# Patient Record
Sex: Male | Born: 1985 | Race: White | Hispanic: No | Marital: Single | Smoking: Former smoker
Health system: Southern US, Community
[De-identification: ages and names within clinical notes are randomized; demographics above are authoritative.]

## PROBLEM LIST (undated history)

## (undated) DIAGNOSIS — Z789 Other specified health status: Secondary | ICD-10-CM

## (undated) DIAGNOSIS — F419 Anxiety disorder, unspecified: Secondary | ICD-10-CM

## (undated) HISTORY — PX: WRIST SURGERY: SHX841

---
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
## 2006-01-22 ENCOUNTER — Emergency Department (HOSPITAL_COMMUNITY): Admission: EM | Admit: 2006-01-22 | Discharge: 2006-01-22 | Payer: Self-pay | Admitting: Emergency Medicine

## 2006-06-18 ENCOUNTER — Emergency Department (HOSPITAL_COMMUNITY): Admission: EM | Admit: 2006-06-18 | Discharge: 2006-06-19 | Payer: Self-pay | Admitting: Emergency Medicine

## 2006-07-07 ENCOUNTER — Emergency Department (HOSPITAL_COMMUNITY): Admission: EM | Admit: 2006-07-07 | Discharge: 2006-07-07 | Payer: Self-pay | Admitting: Emergency Medicine

## 2006-08-30 ENCOUNTER — Emergency Department (HOSPITAL_COMMUNITY): Admission: EM | Admit: 2006-08-30 | Discharge: 2006-08-30 | Payer: Self-pay | Admitting: Emergency Medicine

## 2006-09-13 ENCOUNTER — Emergency Department (HOSPITAL_COMMUNITY): Admission: EM | Admit: 2006-09-13 | Discharge: 2006-09-13 | Payer: Self-pay | Admitting: Emergency Medicine

## 2006-12-01 ENCOUNTER — Emergency Department (HOSPITAL_COMMUNITY): Admission: EM | Admit: 2006-12-01 | Discharge: 2006-12-01 | Payer: Self-pay | Admitting: Emergency Medicine

## 2006-12-17 ENCOUNTER — Emergency Department: Payer: Self-pay | Admitting: Emergency Medicine

## 2006-12-18 ENCOUNTER — Emergency Department (HOSPITAL_COMMUNITY): Admission: EM | Admit: 2006-12-18 | Discharge: 2006-12-18 | Payer: Self-pay | Admitting: Emergency Medicine

## 2007-01-17 ENCOUNTER — Emergency Department (HOSPITAL_COMMUNITY): Admission: EM | Admit: 2007-01-17 | Discharge: 2007-01-17 | Payer: Self-pay | Admitting: Emergency Medicine

## 2007-01-25 ENCOUNTER — Emergency Department (HOSPITAL_COMMUNITY): Admission: EM | Admit: 2007-01-25 | Discharge: 2007-01-25 | Payer: Self-pay | Admitting: Emergency Medicine

## 2007-02-10 ENCOUNTER — Emergency Department (HOSPITAL_COMMUNITY): Admission: EM | Admit: 2007-02-10 | Discharge: 2007-02-10 | Payer: Self-pay | Admitting: Emergency Medicine

## 2007-02-20 ENCOUNTER — Emergency Department (HOSPITAL_COMMUNITY): Admission: EM | Admit: 2007-02-20 | Discharge: 2007-02-20 | Payer: Self-pay | Admitting: Emergency Medicine

## 2007-04-26 ENCOUNTER — Emergency Department (HOSPITAL_COMMUNITY): Admission: EM | Admit: 2007-04-26 | Discharge: 2007-04-26 | Payer: Self-pay | Admitting: Emergency Medicine

## 2007-07-01 ENCOUNTER — Emergency Department: Payer: Self-pay | Admitting: Emergency Medicine

## 2007-07-06 ENCOUNTER — Emergency Department (HOSPITAL_COMMUNITY): Admission: EM | Admit: 2007-07-06 | Discharge: 2007-07-06 | Payer: Self-pay | Admitting: Emergency Medicine

## 2007-08-12 ENCOUNTER — Emergency Department (HOSPITAL_COMMUNITY): Admission: EM | Admit: 2007-08-12 | Discharge: 2007-08-12 | Payer: Self-pay | Admitting: Emergency Medicine

## 2007-10-16 ENCOUNTER — Emergency Department (HOSPITAL_COMMUNITY): Admission: EM | Admit: 2007-10-16 | Discharge: 2007-10-16 | Payer: Self-pay | Admitting: Emergency Medicine

## 2008-02-07 ENCOUNTER — Emergency Department (HOSPITAL_COMMUNITY): Admission: EM | Admit: 2008-02-07 | Discharge: 2008-02-07 | Payer: Self-pay | Admitting: Emergency Medicine

## 2008-02-28 ENCOUNTER — Emergency Department (HOSPITAL_COMMUNITY): Admission: EM | Admit: 2008-02-28 | Discharge: 2008-02-28 | Payer: Self-pay | Admitting: Emergency Medicine

## 2008-03-01 ENCOUNTER — Emergency Department (HOSPITAL_COMMUNITY): Admission: EM | Admit: 2008-03-01 | Discharge: 2008-03-01 | Payer: Self-pay | Admitting: Emergency Medicine

## 2008-06-24 ENCOUNTER — Emergency Department (HOSPITAL_COMMUNITY): Admission: EM | Admit: 2008-06-24 | Discharge: 2008-06-24 | Payer: Self-pay | Admitting: Emergency Medicine

## 2008-07-08 ENCOUNTER — Emergency Department (HOSPITAL_COMMUNITY): Admission: EM | Admit: 2008-07-08 | Discharge: 2008-07-08 | Payer: Self-pay | Admitting: Emergency Medicine

## 2008-09-14 ENCOUNTER — Emergency Department (HOSPITAL_COMMUNITY): Admission: EM | Admit: 2008-09-14 | Discharge: 2008-09-14 | Payer: Self-pay | Admitting: Emergency Medicine

## 2008-10-17 ENCOUNTER — Emergency Department (HOSPITAL_COMMUNITY): Admission: EM | Admit: 2008-10-17 | Discharge: 2008-10-17 | Payer: Self-pay | Admitting: Emergency Medicine

## 2008-11-04 ENCOUNTER — Emergency Department (HOSPITAL_COMMUNITY): Admission: EM | Admit: 2008-11-04 | Discharge: 2008-11-04 | Payer: Self-pay | Admitting: Emergency Medicine

## 2008-11-09 ENCOUNTER — Emergency Department (HOSPITAL_COMMUNITY): Admission: EM | Admit: 2008-11-09 | Discharge: 2008-11-09 | Payer: Self-pay | Admitting: Emergency Medicine

## 2008-12-25 ENCOUNTER — Emergency Department (HOSPITAL_COMMUNITY): Admission: EM | Admit: 2008-12-25 | Discharge: 2008-12-25 | Payer: Self-pay | Admitting: Emergency Medicine

## 2009-04-21 ENCOUNTER — Emergency Department (HOSPITAL_COMMUNITY): Admission: EM | Admit: 2009-04-21 | Discharge: 2009-04-21 | Payer: Self-pay | Admitting: Emergency Medicine

## 2011-08-18 ENCOUNTER — Emergency Department (HOSPITAL_COMMUNITY)
Admission: EM | Admit: 2011-08-18 | Discharge: 2011-08-18 | Disposition: A | Discharge status: Home / Self Care | Payer: Self-pay | Attending: Emergency Medicine | Admitting: Emergency Medicine

## 2011-08-18 ENCOUNTER — Encounter (HOSPITAL_COMMUNITY): Payer: Self-pay | Admitting: Emergency Medicine

## 2011-08-18 DIAGNOSIS — F411 Generalized anxiety disorder: Secondary | ICD-10-CM | POA: Insufficient documentation

## 2011-08-18 DIAGNOSIS — F17200 Nicotine dependence, unspecified, uncomplicated: Secondary | ICD-10-CM | POA: Insufficient documentation

## 2011-08-18 DIAGNOSIS — F410 Panic disorder [episodic paroxysmal anxiety] without agoraphobia: Secondary | ICD-10-CM

## 2011-08-18 HISTORY — DX: Anxiety disorder, unspecified: F41.9

## 2011-08-18 LAB — CBC
MCV: 88.4 fL (ref 78.0–100.0)
Platelets: 254 10*3/uL (ref 150–400)
RBC: 5.42 MIL/uL (ref 4.22–5.81)
WBC: 12 10*3/uL — ABNORMAL HIGH (ref 4.0–10.5)

## 2011-08-18 LAB — COMPREHENSIVE METABOLIC PANEL
ALT: 45 U/L (ref 0–53)
AST: 35 U/L (ref 0–37)
CO2: 24 mEq/L (ref 19–32)
Chloride: 103 mEq/L (ref 96–112)
GFR calc non Af Amer: >90 mL/min (ref >90)
Sodium: 141 mEq/L (ref 135–145)
Total Bilirubin: 0.5 mg/dL (ref 0.3–1.2)

## 2011-08-18 LAB — RAPID URINE DRUG SCREEN, HOSP PERFORMED
Amphetamines: NOT DETECTED
Tetrahydrocannabinol: NOT DETECTED

## 2011-08-18 MED ORDER — BUSPIRONE HCL 10 MG PO TABS: 10.0000 mg | ORAL_TABLET | Freq: Two times a day (BID) | ORAL | Status: DC

## 2011-08-18 NOTE — ED Notes (Signed)
Pt denies suicidal ideation. Pt also denies homicidal ideation.
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
## 2011-08-18 NOTE — ED Notes (Signed)
Pt is sleeping on the stretcher.

## 2011-08-18 NOTE — ED Notes (Signed)
Pt refused to sign that he received a copy of his discharge instructions and that the discharge instructions were reviewed with him.  He ripped the Rx for Buspar off the discharge instructions and handed it to me. He kept the discharge instructions and left.

## 2011-08-18 NOTE — ED Provider Notes (Addendum)
History  Scribed for Celene Kras, MD, the patient was seen in room TR10C/TR10C. This chart was scribed by Candelaria Stagers. The patient's care started at 5:39 PM   CSN: [id number]  Arrival date & time 08/18/11  1627   First MD Initiated Contact with Patient 08/18/11 1738      Chief Complaint  Patient presents with  . Panic Attack     The history is provided by the patient.   Ricky Huang is a 26 y.o. male who presents to the Emergency Department complaining of panic attacks and severe anxiety that he reports started after getting released from prison about six months ago.  He states that the anxiety has gotten worse recently.  Upon arrival to the ED he was crying, hyperventilating, and anxious per triage nurse.  He denies suicidal ideation or homicidal ideation.  He has an appointment one month from now at a behavioral health center and feels that he cannot make it until then.    Past Medical History  Diagnosis Date  . Anxiety     No past surgical history on file.  No family history on file.  History  Substance Use Topics  . Smoking status: Current Everyday Smoker -- 1.0 packs/day  . Smokeless tobacco: Not on file  . Alcohol Use: No      Review of Systems  Constitutional: Negative for fever.       10 Systems reviewed and are negative for acute change except as noted in the HPI.  HENT: Negative for congestion.   Eyes: Negative for discharge and redness.  Respiratory: Negative for cough and shortness of breath.   Cardiovascular: Negative for chest pain.  Gastrointestinal: Negative for vomiting and abdominal pain.  Musculoskeletal: Negative for back pain.  Skin: Negative for rash.  Neurological: Negative for syncope, numbness and headaches.  Psychiatric/Behavioral: The patient is nervous/anxious.     Allergies  Morphine and related and Tramadol  Home Medications  No current outpatient prescriptions on file.  BP 137/104  Pulse 104  Temp 98.3 F (36.8 C) (Oral)   Resp 16  SpO2 97%  Physical Exam  Nursing note and vitals reviewed. Constitutional: He appears well-developed and well-nourished. No distress.  HENT:  Head: Normocephalic and atraumatic.  Right Ear: External ear normal.  Left Ear: External ear normal.  Eyes: Conjunctivae are normal. Right eye exhibits no discharge. Left eye exhibits no discharge. No scleral icterus.  Neck: Neck supple. No tracheal deviation present.  Cardiovascular: Normal rate, regular rhythm and intact distal pulses.   Pulmonary/Chest: Effort normal and breath sounds normal. No stridor. No respiratory distress. He has no wheezes. He has no rales.  Abdominal: Soft. Bowel sounds are normal. He exhibits no distension. There is no tenderness. There is no rebound and no guarding.  Musculoskeletal: He exhibits no edema and no tenderness.  Neurological: He is alert. He has normal strength. No sensory deficit. Cranial nerve deficit:  no gross defecits noted. He exhibits normal muscle tone. He displays no seizure activity. Coordination normal.  Skin: Skin is warm and dry. No rash noted.  Psychiatric: He has a normal mood and affect. His mood appears not anxious. His speech is not rapid and/or pressured. He is not agitated and not aggressive. He expresses no homicidal and no suicidal ideation.    ED Course  Procedures   DIAGNOSTIC STUDIES: Oxygen Saturation is 97% on room air, normal by my interpretation.    COORDINATION OF CARE:  EKG Rate 117 Sinus tachycardia Right  axis deviation T wave abnormality, consider inferior ischemia (doubt ischemia likely rate related) Artifact Baseline wander Abnormal ECG No prior to compare   Labs Reviewed  CBC - Abnormal; Notable for the following:    WBC 12.0 (*)     All other components within normal limits  COMPREHENSIVE METABOLIC PANEL  ETHANOL  ACETAMINOPHEN LEVEL  URINE RAPID DRUG SCREEN (HOSP PERFORMED)   No results found.   1. Anxiety attack       MDM  he  denies suicidal or homicidal ideation. I offered an evaluation by one of our act team personnel. Patient states he has been seeing therapist the last couple of days he does not need to see another one right now. He requests a prescription For Xanax. I explained to the patient this medication has a significant addiction potential. Since he is having attacks at least daily it is better for him to be on  maintenance type medication. I will give him a prescription for buspirone  for anxiety until he can see his psychiatrist.  6:09 PM the nurse notified me that the family is concerned and thinks he may be suicidal. Patient denies specifically to me. I have offered evaluation by our psychiatric team he declines. We will notify the family that if they are still concerned about his safety they can try having him involuntarily committed by the magistrate.   I personally performed the services described in this documentation, which was scribed in my presence.  The recorded information has been reviewed and considered.         Celene Kras, MD 08/18/11 1811  Celene Kras, MD 09/15/11 1440

## 2011-11-22 ENCOUNTER — Encounter (HOSPITAL_COMMUNITY): Payer: Self-pay | Admitting: Emergency Medicine

## 2011-11-22 ENCOUNTER — Emergency Department (HOSPITAL_COMMUNITY): Payer: Self-pay

## 2011-11-22 ENCOUNTER — Emergency Department (HOSPITAL_COMMUNITY)
Admission: EM | Admit: 2011-11-22 | Discharge: 2011-11-22 | Disposition: A | Discharge status: Home / Self Care | Payer: Worker's Compensation | Attending: Emergency Medicine | Admitting: Emergency Medicine

## 2011-11-22 DIAGNOSIS — Y939 Activity, unspecified: Secondary | ICD-10-CM | POA: Insufficient documentation

## 2011-11-22 DIAGNOSIS — F411 Generalized anxiety disorder: Secondary | ICD-10-CM | POA: Insufficient documentation

## 2011-11-22 DIAGNOSIS — T07XXXA Unspecified multiple injuries, initial encounter: Secondary | ICD-10-CM | POA: Insufficient documentation

## 2011-11-22 DIAGNOSIS — F17200 Nicotine dependence, unspecified, uncomplicated: Secondary | ICD-10-CM | POA: Insufficient documentation

## 2011-11-22 DIAGNOSIS — Y929 Unspecified place or not applicable: Secondary | ICD-10-CM | POA: Insufficient documentation

## 2011-11-22 MED ORDER — OXYCODONE-ACETAMINOPHEN 5-325 MG PO TABS
2.0000 | ORAL_TABLET | Freq: Once | ORAL | Status: AC
  Administered 2011-11-22: 2 via ORAL
  Filled 2011-11-22: qty 2

## 2011-11-22 MED ORDER — LORAZEPAM 1 MG PO TABS
1.0000 mg | ORAL_TABLET | Freq: Once | ORAL | Status: AC
  Administered 2011-11-22: 1 mg via ORAL
  Filled 2011-11-22: qty 1

## 2011-11-22 MED ORDER — OXYCODONE-ACETAMINOPHEN 5-325 MG PO TABS: 1.0000 | ORAL_TABLET | ORAL | Status: DC | PRN
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
## 2011-11-22 NOTE — ED Notes (Signed)
Pt changed mind and returned.  MD informed.  C collar replaced.

## 2011-11-22 NOTE — ED Notes (Addendum)
Pt became upset when temperature was being taken.  States "you are doing nothing for me."  Pt crying. Asked pt if he wanted anything.  States he did not want temperature taken.  PT continued to cry loudly.  Security called to Chief Technology Officer spoke with pt.  Pt wanted to leave AMA without seeing MD.  Pt given wheelchair and left limping.  Pt removed c collar

## 2011-11-22 NOTE — ED Notes (Signed)
Pt left ama.  MD informed.
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
## 2011-11-22 NOTE — ED Notes (Signed)
ZOX:[id number]<AV> Expected date:<BR> Expected time:<BR> Means of arrival:<BR> Comments:<BR> Hold for hall G
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
## 2012-03-14 ENCOUNTER — Emergency Department (HOSPITAL_COMMUNITY)
Admission: EM | Admit: 2012-03-14 | Discharge: 2012-03-14 | Disposition: A | Discharge status: Home / Self Care | Payer: Self-pay | Attending: Emergency Medicine | Admitting: Emergency Medicine

## 2012-03-14 ENCOUNTER — Emergency Department (HOSPITAL_COMMUNITY): Payer: Self-pay

## 2012-03-14 ENCOUNTER — Encounter (HOSPITAL_COMMUNITY): Payer: Self-pay | Admitting: Emergency Medicine

## 2012-03-14 DIAGNOSIS — F17200 Nicotine dependence, unspecified, uncomplicated: Secondary | ICD-10-CM | POA: Insufficient documentation

## 2012-03-14 DIAGNOSIS — M25561 Pain in right knee: Secondary | ICD-10-CM

## 2012-03-14 DIAGNOSIS — S8990XA Unspecified injury of unspecified lower leg, initial encounter: Secondary | ICD-10-CM | POA: Insufficient documentation

## 2012-03-14 DIAGNOSIS — Y9301 Activity, walking, marching and hiking: Secondary | ICD-10-CM | POA: Insufficient documentation

## 2012-03-14 DIAGNOSIS — G8929 Other chronic pain: Secondary | ICD-10-CM | POA: Insufficient documentation

## 2012-03-14 DIAGNOSIS — Y9289 Other specified places as the place of occurrence of the external cause: Secondary | ICD-10-CM | POA: Insufficient documentation

## 2012-03-14 DIAGNOSIS — W010XXA Fall on same level from slipping, tripping and stumbling without subsequent striking against object, initial encounter: Secondary | ICD-10-CM | POA: Insufficient documentation

## 2012-03-14 DIAGNOSIS — Z8659 Personal history of other mental and behavioral disorders: Secondary | ICD-10-CM | POA: Insufficient documentation

## 2012-03-14 DIAGNOSIS — M545 Low back pain, unspecified: Secondary | ICD-10-CM | POA: Insufficient documentation

## 2012-03-14 DIAGNOSIS — S99929A Unspecified injury of unspecified foot, initial encounter: Secondary | ICD-10-CM | POA: Insufficient documentation

## 2012-03-14 MED ORDER — OXYCODONE-ACETAMINOPHEN 5-325 MG PO TABS
1.0000 | ORAL_TABLET | Freq: Once | ORAL | Status: AC
  Administered 2012-03-14: 1 via ORAL
  Filled 2012-03-14: qty 1

## 2012-03-14 MED ORDER — HYDROCODONE-ACETAMINOPHEN 5-325 MG PO TABS: 1.0000 | ORAL_TABLET | Freq: Four times a day (QID) | ORAL | Status: DC | PRN

## 2012-03-14 MED ORDER — NAPROXEN 500 MG PO TABS: 500.0000 mg | ORAL_TABLET | Freq: Two times a day (BID) | ORAL | Status: DC

## 2012-03-14 NOTE — Progress Notes (Signed)
Orthopedic Tech Progress Note Patient Details:  Ricky Huang 1985/01/21 [id number] Knee sleeve to small for patients leg. Ortho Devices Type of Ortho Device: Ace wrap Ortho Device/Splint Location: (R) LE Ortho Device/Splint Interventions: Application   [name] 03/14/2012, 5:12 PM

## 2012-03-14 NOTE — ED Provider Notes (Signed)
History    This chart was scribed for non-physician practitioner working with Joya Gaskins, MD by Sofie Rower, ED Scribe. This patient was seen in room TR10C/TR10C and the patient's care was started at 4:21PM.   CSN: [id number]  Arrival date & time 03/14/12  1402   First MD Initiated Contact with Patient 03/14/12 1621      No chief complaint on file.   (Consider location/radiation/quality/duration/timing/severity/associated sxs/prior treatment) The history is provided by the patient. No language interpreter was used.    Ricky Huang is a 27 y.o. male , with a hx of chronic back pain, who presents to the Emergency Department complaining of sudden, progressively worsening, knee pain located at the right knee, radiating upwards towards the right hip, onset yesterday (03/13/12).  Associated symptoms include back pain located at the lumbar region. The pt reports he slipped and fell (height of fall reported at 5 feet) while walking down a set of slippery, icy steps, yesterday afternoon (03/14/12). At the time of the incident, the pt impacted directly at his right knee, upon a stone surface. The pt has not taken any medications PTA to relive his knee pain. Modifying factors include certain movements and positions of the right knee which intensifies the knee pain.  The pt denies hitting his head and LOC.   The pt is a current everyday smoker, however, he does not drink alcohol.    Past Medical History  Diagnosis Date  . Anxiety     History reviewed. No pertinent past surgical history.  History reviewed. No pertinent family history.  History  Substance Use Topics  . Smoking status: Current Every Day Smoker -- 1.00 packs/day  . Smokeless tobacco: Not on file  . Alcohol Use: No      Review of Systems  Musculoskeletal: Positive for back pain and arthralgias.  All other systems reviewed and are negative.    Allergies  Morphine and related and Tramadol  Home Medications  No  current outpatient prescriptions on file.  BP 131/60  Pulse 86  Temp(Src) 98.4 F (36.9 C) (Oral)  Resp 16  SpO2 98%  Physical Exam  Nursing note and vitals reviewed. Constitutional: He appears well-developed and well-nourished. No distress.  HENT:  Head: Normocephalic and atraumatic.  Eyes: Conjunctivae and EOM are normal.  Neck: Normal range of motion. Neck supple.  Cardiovascular:  Intact distal pulses, capillary refill < 3 seconds  Musculoskeletal: He exhibits tenderness.       Right knee: Tenderness found.  Mild right knee ttp, no effusion seen. Normal ROM. All other extremities with normal ROM.  Neurological:  No sensory deficit  Skin: He is not diaphoretic.  Skin intact, no tenting    ED Course  Procedures (including critical care time)  DIAGNOSTIC STUDIES: Oxygen Saturation is 98% on room air, normal by my interpretation.    COORDINATION OF CARE:   4:58 PM- Treatment plan concerning elevation, ice, and compression and f/u with orthopedist discussed with patient. Pt agrees with treatment.     Labs Reviewed - No data to display Dg Knee Complete 4 Views Right  03/14/2012  *RADIOLOGY REPORT*  Clinical Data: Fall, knee pain  RIGHT KNEE - COMPLETE 4+ VIEW  Comparison: 02/28/2008  Findings: No fracture or dislocation is seen.  The joint spaces are preserved.  The visualized soft tissues are unremarkable.  No definite suprapatellar knee joint effusion.  IMPRESSION: No fracture or dislocation is seen.   Original Report Authenticated By: Charline Bills, M.D.  No diagnosis found.    MDM  Knee pain s/p fall  Patient X-Ray negative for obvious fracture or dislocation. Pain managed in ED. Pt advised to follow up with orthopedics if symptoms persist for possibility of missed fracture diagnosis. Patient given brace while in ED, conservative therapy recommended and discussed. Pt reports has crutches at home- wt bearing as tolerated discussed.  Patient will be dc home  & is agreeable with above plan.       I personally performed the services described in this documentation, which was scribed in my presence. The recorded information has been reviewed and is accurate.      Jaci Carrel, [place] 03/14/12 1719

## 2012-03-14 NOTE — ED Notes (Signed)
Pt reports slip and fall yesterday on ice. States pain to right knee throbbing, intense, feels like knee will give out. Also reports low back pain aggravated from fall yesterday.

## 2012-03-16 NOTE — ED Provider Notes (Signed)
Medical screening examination/treatment/procedure(s) were performed by non-physician practitioner and as supervising physician I was immediately available for consultation/collaboration.   Joya Gaskins, MD 03/16/12 2209

## 2012-05-23 ENCOUNTER — Other Ambulatory Visit: Payer: Self-pay | Admitting: Neurological Surgery

## 2012-05-23 DIAGNOSIS — M545 Low back pain, unspecified: Secondary | ICD-10-CM

## 2012-05-25 ENCOUNTER — Ambulatory Visit
Admission: RE | Admit: 2012-05-25 | Discharge: 2012-05-25 | Disposition: A | Discharge status: Home / Self Care | Payer: Self-pay | Source: Ambulatory Visit | Attending: Neurological Surgery | Admitting: Neurological Surgery

## 2012-05-25 DIAGNOSIS — M545 Low back pain, unspecified: Secondary | ICD-10-CM

## 2012-08-27 ENCOUNTER — Emergency Department (HOSPITAL_COMMUNITY)
Admission: EM | Admit: 2012-08-27 | Discharge: 2012-08-27 | Disposition: A | Discharge status: Home / Self Care | Payer: Self-pay | Attending: Emergency Medicine | Admitting: Emergency Medicine

## 2012-08-27 ENCOUNTER — Emergency Department (HOSPITAL_COMMUNITY): Payer: Self-pay

## 2012-08-27 ENCOUNTER — Encounter (HOSPITAL_COMMUNITY): Payer: Self-pay | Admitting: *Deleted

## 2012-08-27 DIAGNOSIS — S61511A Laceration without foreign body of right wrist, initial encounter: Secondary | ICD-10-CM

## 2012-08-27 DIAGNOSIS — Y929 Unspecified place or not applicable: Secondary | ICD-10-CM | POA: Insufficient documentation

## 2012-08-27 DIAGNOSIS — S66909A Unspecified injury of unspecified muscle, fascia and tendon at wrist and hand level, unspecified hand, initial encounter: Secondary | ICD-10-CM | POA: Insufficient documentation

## 2012-08-27 DIAGNOSIS — W010XXA Fall on same level from slipping, tripping and stumbling without subsequent striking against object, initial encounter: Secondary | ICD-10-CM | POA: Insufficient documentation

## 2012-08-27 DIAGNOSIS — Y9389 Activity, other specified: Secondary | ICD-10-CM | POA: Insufficient documentation

## 2012-08-27 DIAGNOSIS — Z8659 Personal history of other mental and behavioral disorders: Secondary | ICD-10-CM | POA: Insufficient documentation

## 2012-08-27 DIAGNOSIS — S61509A Unspecified open wound of unspecified wrist, initial encounter: Secondary | ICD-10-CM | POA: Insufficient documentation

## 2012-08-27 DIAGNOSIS — F17200 Nicotine dependence, unspecified, uncomplicated: Secondary | ICD-10-CM | POA: Insufficient documentation

## 2012-08-27 DIAGNOSIS — W268XXA Contact with other sharp object(s), not elsewhere classified, initial encounter: Secondary | ICD-10-CM | POA: Insufficient documentation

## 2012-08-27 MED ORDER — HYDROMORPHONE HCL PF 1 MG/ML IJ SOLN
1.0000 mg | Freq: Once | INTRAMUSCULAR | Status: DC
  Filled 2012-08-27: qty 1

## 2012-08-27 MED ORDER — LIDOCAINE HCL (PF) 1 % IJ SOLN
30.0000 mL | Freq: Once | INTRAMUSCULAR | Status: AC
  Administered 2012-08-27: 30 mL via INTRADERMAL
  Filled 2012-08-27: qty 30

## 2012-08-27 MED ORDER — TETANUS-DIPHTH-ACELL PERTUSSIS 5-2.5-18.5 LF-MCG/0.5 IM SUSP
0.5000 mL | Freq: Once | INTRAMUSCULAR | Status: AC
  Administered 2012-08-27: 0.5 mL via INTRAMUSCULAR
  Filled 2012-08-27: qty 0.5

## 2012-08-27 MED ORDER — ONDANSETRON 4 MG PO TBDP: ORAL_TABLET | ORAL | Status: DC

## 2012-08-27 MED ORDER — HYDROCODONE-ACETAMINOPHEN 5-325 MG PO TABS: 2.0000 | ORAL_TABLET | ORAL | Status: DC | PRN

## 2012-08-27 MED ORDER — HYDROMORPHONE HCL PF 1 MG/ML IJ SOLN
1.0000 mg | Freq: Once | INTRAMUSCULAR | Status: AC
  Administered 2012-08-27: 1 mg via INTRAVENOUS
  Filled 2012-08-27: qty 1

## 2012-08-27 MED ORDER — ONDANSETRON 4 MG PO TBDP
4.0000 mg | ORAL_TABLET | Freq: Once | ORAL | Status: AC
  Administered 2012-08-27: 4 mg via ORAL
  Filled 2012-08-27: qty 1

## 2012-08-27 NOTE — ED Notes (Signed)
Has full ROM to R fingers-- unable to move wrist laterally-- pt states due to pain
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
## 2012-08-27 NOTE — ED Provider Notes (Signed)
CSN: [id number]     Arrival date & time 08/27/12  1478 History     First MD Initiated Contact with Patient 08/27/12 [phone]     Chief Complaint  Patient presents with  . Laceration   (Consider location/radiation/quality/duration/timing/severity/associated sxs/prior Treatment) Patient is a 27 y.o. male presenting with skin laceration. The history is provided by the patient.  Laceration Location:  Shoulder/arm Shoulder/arm laceration location:  R forearm Length (cm):  4 Depth:  Through underlying tissue Quality: straight   Bleeding: controlled   Time since incident:  1 hour Laceration mechanism:  Broken glass Pain details:    Quality:  Sharp   Severity:  Severe   Timing:  Constant   Progression:  Unchanged Foreign body present:  Unable to specify Relieved by:  None tried Worsened by:  Nothing tried Ineffective treatments:  None tried Tetanus status:  Unknown   Past Medical History  Diagnosis Date  . Anxiety    History reviewed. No pertinent past surgical history. No family history on file. History  Substance Use Topics  . Smoking status: Current Every Day Smoker -- 1.00 packs/day  . Smokeless tobacco: Not on file  . Alcohol Use: No    Review of Systems  Gastrointestinal: Negative for nausea and vomiting.  Musculoskeletal:       R wrist pain  Skin: Positive for wound (R wrist laceration).  Neurological: Negative for dizziness, syncope, weakness, light-headedness, numbness and headaches.  All other systems reviewed and are negative.    Allergies  Morphine and related and Tramadol  Home Medications   Current Outpatient Rx  Name  Route  Sig  Dispense  Refill  . HYDROcodone-acetaminophen (NORCO) 5-325 MG per tablet   Oral   Take 2 tablets by mouth every 4 (four) hours as needed for pain.   10 tablet   0   . ondansetron (ZOFRAN ODT) 4 MG disintegrating tablet      4mg  ODT q4 hours prn nausea/vomit   8 tablet   0    BP 136/64  Pulse 83  Temp(Src) 99.9  F (37.7 C) (Oral)  Resp 17  Ht 6' (1.829 m)  Wt 275 lb (124.739 kg)  BMI 37.29 kg/m2  SpO2 99% Physical Exam  Constitutional: He is oriented to person, place, and time. He appears well-developed and well-nourished. No distress.  HENT:  Head: Normocephalic and atraumatic.  Eyes: EOM are normal. Pupils are equal, round, and reactive to light.  Neck: Normal range of motion. Neck supple.  Cardiovascular: Normal rate, regular rhythm and normal heart sounds.   Pulses:      Radial pulses are 2+ on the right side, and 2+ on the left side.  Pulmonary/Chest: Breath sounds normal.  Abdominal: Soft. Bowel sounds are normal. He exhibits no distension. There is no tenderness.  Musculoskeletal:       Right forearm: He exhibits tenderness and laceration. He exhibits no swelling and no deformity.       Arms: Neurological: He is alert and oriented to person, place, and time. He has normal strength. No sensory deficit. He exhibits normal muscle tone. Coordination normal.  RUE strength and sensation intact  Skin: Skin is warm and dry. Laceration (R dorsolateral distal forearm: 4 cm, elliptical.  +tendon exposed) noted. He is not diaphoretic.    ED Course   LACERATION REPAIR Date/Time: 08/27/2012 11:36 AM Performed by: Jodean Lima Authorized by: Jodean Lima Consent: Verbal consent obtained. Risks and benefits: risks, benefits and alternatives were discussed Consent given by:  patient Patient understanding: patient states understanding of the procedure being performed Imaging studies: imaging studies available Patient identity confirmed: verbally with patient Time out: Immediately prior to procedure a "time out" was called to verify the correct patient, procedure, equipment, support staff and site/side marked as required. Body area: upper extremity Location details: right lower arm Laceration length: 4 cm Foreign bodies: no foreign bodies Tendon involvement: superficial Nerve involvement:  none Vascular damage: no Anesthesia: local infiltration Local anesthetic: lidocaine 1% without epinephrine Anesthetic total: 10 ml Patient sedated: no Preparation: Patient was prepped and draped in the usual sterile fashion. Irrigation solution: saline Irrigation method: syringe Amount of cleaning: standard Debridement: none Degree of undermining: none Skin closure: 4-0 nylon Number of sutures: 11 Technique: simple Approximation: close Approximation difficulty: simple Dressing: 4x4 sterile gauze and splint Patient tolerance: Patient tolerated the procedure well with no immediate complications.   (including critical care time)  Labs Reviewed - No data to display Dg Wrist Complete Right  08/27/2012   *RADIOLOGY REPORT*  Clinical Data: Laceration.  RIGHT WRIST - COMPLETE 3+ VIEW  Comparison: 01/30/2010.  Findings: No acute fracture or dislocation.  No radiopaque foreign body.  Question remote right fifth metacarpal fracture versus small exostosis.  Appearance unchanged.  IMPRESSION: No acute fracture or dislocation.  No radiopaque foreign body.  Question remote right fifth metacarpal fracture versus small exostosis.  Appearance unchanged   Original Report Authenticated By: Lacy Duverney, M.D.   1. Laceration of wrist with tendon involvement, right, initial encounter     MDM  27 yo. M presenting with R distal forearm laceration.  Pt tripped in door entryway and fell forward through a glass door with R arm outstretched.  No head injury or LOC.  Laceration on dorsolateral aspect of R distal forearm. 4 cm elliptical laceration with tendon exposed and evidence of retinaculum tear.  RUE neurovascularly intact.  Full ROM, strength, and sensation.  XR obtained showing no fractures and no foreign bodies. Discussed with Hand Surgery, they advise closing laceration and will see pt in office tomorrow.  Laceration irrigated thoroughly and closed as above and splint applied.  Tetanus updated.  Pt given  contact info, will f/u tomorrow with hand surgery.  Discussed with attending Dr. Ranae Palms.  Jodean Lima, MD 08/27/12 [phone]

## 2012-08-27 NOTE — ED Notes (Signed)
Right wrist laceration cleaned with normal saline and patted dry with a 2x2, applied non-adherent pad, kerlexed wrist and taped

## 2012-08-28 ENCOUNTER — Other Ambulatory Visit: Payer: Self-pay | Admitting: Orthopedic Surgery

## 2012-08-28 NOTE — ED Provider Notes (Signed)
I saw and evaluated the patient, reviewed the resident's note and I agree with the findings and plan.  I was directly supervised and was present for the key portions of the procedure.  Pt with laceration to distal fore-arm with exposed tendon and concern for retinaculum disruption. Discussed with hand surgeon who advised superficial closure and will see in office tomorrow.    Loren Racer, MD 08/28/12 [phone]

## 2012-08-29 ENCOUNTER — Other Ambulatory Visit: Payer: Self-pay | Admitting: Orthopedic Surgery

## 2012-08-29 ENCOUNTER — Encounter (HOSPITAL_COMMUNITY): Payer: Self-pay | Admitting: Pharmacy Technician

## 2012-08-30 ENCOUNTER — Encounter (HOSPITAL_COMMUNITY): Payer: Self-pay | Admitting: *Deleted

## 2012-08-30 MED ORDER — DEXTROSE 5 % IV SOLN
3.0000 g | INTRAVENOUS | Status: AC
  Administered 2012-08-31: 3 g via INTRAVENOUS
  Filled 2012-08-30: qty 3000

## 2012-08-30 NOTE — Progress Notes (Signed)
During pre-op phone call, pt states that his right hand is very swollen and he has no feeling in it. States it started when he woke up this am. States he's not able to use the black brace that he was given and he has loosened the wrap. Hand still swollen and numb. He has not called the doctor. I told him that he needed to call Dr. Ronie Spies office and talk with the surgeon on call and let them know about this. I gave him the office number (he said he didn't have it).

## 2012-08-31 ENCOUNTER — Ambulatory Visit (HOSPITAL_COMMUNITY)
Admission: RE | Admit: 2012-08-31 | Discharge: 2012-08-31 | Disposition: A | Discharge status: Home / Self Care | Payer: Self-pay | Source: Ambulatory Visit | Attending: Orthopedic Surgery | Admitting: Orthopedic Surgery

## 2012-08-31 ENCOUNTER — Ambulatory Visit (HOSPITAL_COMMUNITY): Payer: Self-pay | Admitting: Certified Registered"

## 2012-08-31 ENCOUNTER — Encounter (HOSPITAL_COMMUNITY)
Admission: RE | Disposition: A | Discharge status: Home / Self Care | Payer: Self-pay | Source: Ambulatory Visit | Attending: Orthopedic Surgery

## 2012-08-31 ENCOUNTER — Encounter (HOSPITAL_COMMUNITY): Payer: Self-pay | Admitting: Certified Registered"

## 2012-08-31 ENCOUNTER — Encounter (HOSPITAL_COMMUNITY): Payer: Self-pay | Admitting: *Deleted

## 2012-08-31 DIAGNOSIS — Z6841 Body Mass Index (BMI) 40.0 and over, adult: Secondary | ICD-10-CM | POA: Insufficient documentation

## 2012-08-31 DIAGNOSIS — F411 Generalized anxiety disorder: Secondary | ICD-10-CM | POA: Insufficient documentation

## 2012-08-31 DIAGNOSIS — S61509A Unspecified open wound of unspecified wrist, initial encounter: Secondary | ICD-10-CM | POA: Insufficient documentation

## 2012-08-31 DIAGNOSIS — S61501A Unspecified open wound of right wrist, initial encounter: Secondary | ICD-10-CM

## 2012-08-31 DIAGNOSIS — S66909A Unspecified injury of unspecified muscle, fascia and tendon at wrist and hand level, unspecified hand, initial encounter: Secondary | ICD-10-CM | POA: Insufficient documentation

## 2012-08-31 DIAGNOSIS — Z886 Allergy status to analgesic agent status: Secondary | ICD-10-CM | POA: Insufficient documentation

## 2012-08-31 DIAGNOSIS — Z885 Allergy status to narcotic agent status: Secondary | ICD-10-CM | POA: Insufficient documentation

## 2012-08-31 DIAGNOSIS — W269XXA Contact with unspecified sharp object(s), initial encounter: Secondary | ICD-10-CM | POA: Insufficient documentation

## 2012-08-31 DIAGNOSIS — Y929 Unspecified place or not applicable: Secondary | ICD-10-CM | POA: Insufficient documentation

## 2012-08-31 DIAGNOSIS — F17200 Nicotine dependence, unspecified, uncomplicated: Secondary | ICD-10-CM | POA: Insufficient documentation

## 2012-08-31 DIAGNOSIS — Z9109 Other allergy status, other than to drugs and biological substances: Secondary | ICD-10-CM | POA: Insufficient documentation

## 2012-08-31 HISTORY — PX: WOUND EXPLORATION: SHX6188

## 2012-08-31 HISTORY — DX: Other specified health status: Z78.9

## 2012-08-31 SURGERY — WOUND EXPLORATION
Anesthesia: General | Site: Arm Lower | Laterality: Right | Wound class: Clean

## 2012-08-31 MED ORDER — OXYCODONE HCL 5 MG PO TABS
5.0000 mg | ORAL_TABLET | Freq: Once | ORAL | Status: AC | PRN
  Administered 2012-08-31: 5 mg via ORAL

## 2012-08-31 MED ORDER — OXYCODONE HCL 5 MG/5ML PO SOLN: 5.0000 mg | Freq: Once | ORAL | Status: AC | PRN

## 2012-08-31 MED ORDER — MEPERIDINE HCL 25 MG/ML IJ SOLN: 6.2500 mg | INTRAMUSCULAR | Status: DC | PRN

## 2012-08-31 MED ORDER — BUPIVACAINE HCL 0.25 % IJ SOLN
INTRAMUSCULAR | Status: DC | PRN
  Administered 2012-08-31: 10 mL

## 2012-08-31 MED ORDER — HYDROMORPHONE HCL PF 1 MG/ML IJ SOLN
0.2500 mg | INTRAMUSCULAR | Status: DC | PRN
  Administered 2012-08-31 (×2): 0.5 mg via INTRAVENOUS

## 2012-08-31 MED ORDER — MIDAZOLAM HCL 5 MG/5ML IJ SOLN
INTRAMUSCULAR | Status: DC | PRN
  Administered 2012-08-31: 2 mg via INTRAVENOUS

## 2012-08-31 MED ORDER — FENTANYL CITRATE 0.05 MG/ML IJ SOLN
INTRAMUSCULAR | Status: DC | PRN
  Administered 2012-08-31 (×2): 50 ug via INTRAVENOUS
  Administered 2012-08-31: 25 ug via INTRAVENOUS

## 2012-08-31 MED ORDER — ONDANSETRON HCL 4 MG/2ML IJ SOLN
INTRAMUSCULAR | Status: DC | PRN
  Administered 2012-08-31: 4 mg via INTRAVENOUS

## 2012-08-31 MED ORDER — SODIUM CHLORIDE 0.9 % IR SOLN
Status: DC | PRN
  Administered 2012-08-31: 1000 mL

## 2012-08-31 MED ORDER — BUPIVACAINE HCL (PF) 0.25 % IJ SOLN
INTRAMUSCULAR | Status: AC
  Filled 2012-08-31: qty 30

## 2012-08-31 MED ORDER — OXYCODONE-ACETAMINOPHEN 5-325 MG PO TABS: 1.0000 | ORAL_TABLET | ORAL | Status: DC | PRN

## 2012-08-31 MED ORDER — OXYCODONE HCL 5 MG PO TABS
ORAL_TABLET | ORAL | Status: AC
  Filled 2012-08-31: qty 1

## 2012-08-31 MED ORDER — HYDROMORPHONE HCL PF 1 MG/ML IJ SOLN
INTRAMUSCULAR | Status: AC
  Filled 2012-08-31: qty 1

## 2012-08-31 MED ORDER — PROPOFOL 10 MG/ML IV BOLUS
INTRAVENOUS | Status: DC | PRN
  Administered 2012-08-31: 200 mg via INTRAVENOUS

## 2012-08-31 MED ORDER — MIDAZOLAM HCL 2 MG/2ML IJ SOLN: 0.5000 mg | Freq: Once | INTRAMUSCULAR | Status: DC | PRN

## 2012-08-31 MED ORDER — PROMETHAZINE HCL 25 MG/ML IJ SOLN: 6.2500 mg | INTRAMUSCULAR | Status: DC | PRN

## 2012-08-31 MED ORDER — CHLORHEXIDINE GLUCONATE 4 % EX LIQD: 60.0000 mL | Freq: Once | CUTANEOUS | Status: DC

## 2012-08-31 MED ORDER — LACTATED RINGERS IV SOLN
INTRAVENOUS | Status: DC
  Administered 2012-08-31: 07:00:00 via INTRAVENOUS

## 2012-08-31 MED ORDER — LIDOCAINE HCL (CARDIAC) 20 MG/ML IV SOLN
INTRAVENOUS | Status: DC | PRN
  Administered 2012-08-31: 30 mg via INTRAVENOUS

## 2012-08-31 SURGICAL SUPPLY — 55 items
BANDAGE ELASTIC 4 VELCRO ST LF (GAUZE/BANDAGES/DRESSINGS) ×1 IMPLANT
BANDAGE GAUZE ELAST BULKY 4 IN (GAUZE/BANDAGES/DRESSINGS) ×1 IMPLANT
BNDG CMPR 9X4 STRL LF SNTH (GAUZE/BANDAGES/DRESSINGS) ×1
BNDG ESMARK 4X9 LF (GAUZE/BANDAGES/DRESSINGS) ×2 IMPLANT
CLOTH BEACON ORANGE TIMEOUT ST (SAFETY) ×2 IMPLANT
CORDS BIPOLAR (ELECTRODE) ×2 IMPLANT
COVER SURGICAL LIGHT HANDLE (MISCELLANEOUS) ×2 IMPLANT
CUFF TOURNIQUET SINGLE 18IN (TOURNIQUET CUFF) ×1 IMPLANT
CUFF TOURNIQUET SINGLE 24IN (TOURNIQUET CUFF) IMPLANT
DECANTER SPIKE VIAL GLASS SM (MISCELLANEOUS) IMPLANT
DRAPE OEC MINIVIEW 54X84 (DRAPES) IMPLANT
DRAPE SURG 17X23 STRL (DRAPES) ×2 IMPLANT
DURAPREP 26ML APPLICATOR (WOUND CARE) ×2 IMPLANT
GAUZE XEROFORM 1X8 LF (GAUZE/BANDAGES/DRESSINGS) ×1 IMPLANT
GLOVE BIO SURGEON STRL SZ8.5 (GLOVE) ×2 IMPLANT
GLOVE BIOGEL PI IND STRL 8 (GLOVE) ×1 IMPLANT
GLOVE BIOGEL PI INDICATOR 8 (GLOVE) ×1
GLOVE BIOGEL PI ORTHO PRO 7.5 (GLOVE) ×1
GLOVE PI ORTHO PRO STRL 7.5 (GLOVE) IMPLANT
GLOVE SURG SS PI 7.5 STRL IVOR (GLOVE) ×1 IMPLANT
GOWN PREVENTION PLUS XLARGE (GOWN DISPOSABLE) ×2 IMPLANT
GOWN SRG XL XLNG 56XLVL 4 (GOWN DISPOSABLE) IMPLANT
GOWN STRL NON-REIN LRG LVL3 (GOWN DISPOSABLE) ×1 IMPLANT
GOWN STRL NON-REIN XL XLG LVL4 (GOWN DISPOSABLE) ×2
KIT BASIN OR (CUSTOM PROCEDURE TRAY) ×2 IMPLANT
KIT ROOM TURNOVER OR (KITS) ×2 IMPLANT
LOOP VESSEL MAXI BLUE (MISCELLANEOUS) IMPLANT
MANIFOLD NEPTUNE II (INSTRUMENTS) ×1 IMPLANT
NEEDLE HYPO 25GX1X1/2 BEV (NEEDLE) ×2 IMPLANT
NS IRRIG 1000ML POUR BTL (IV SOLUTION) ×2 IMPLANT
PACK ORTHO EXTREMITY (CUSTOM PROCEDURE TRAY) ×2 IMPLANT
PAD ARMBOARD 7.5X6 YLW CONV (MISCELLANEOUS) ×3 IMPLANT
PAD CAST 4YDX4 CTTN HI CHSV (CAST SUPPLIES) IMPLANT
PADDING CAST COTTON 4X4 STRL (CAST SUPPLIES) ×2
SPEAR EYE SURG WECK-CEL (MISCELLANEOUS) IMPLANT
SPECIMEN JAR SMALL (MISCELLANEOUS) ×1 IMPLANT
SPONGE GAUZE 4X4 12PLY (GAUZE/BANDAGES/DRESSINGS) ×1 IMPLANT
STRIP CLOSURE SKIN 1/2X4 (GAUZE/BANDAGES/DRESSINGS) IMPLANT
SUCTION FRAZIER TIP 10 FR DISP (SUCTIONS) IMPLANT
SUT ETHIBOND 3-0 V-5 (SUTURE) IMPLANT
SUT ETHILON 5 0 PS 2 18 (SUTURE) IMPLANT
SUT FIBERWIRE 4-0 18 DIAM BLUE (SUTURE) ×2
SUT MERSILENE 4 0 P 3 (SUTURE) IMPLANT
SUT PROLENE 3 0 PS 2 (SUTURE) IMPLANT
SUT PROLENE 6 0 PC 1 (SUTURE) ×1 IMPLANT
SUT SILK 4 0 PS 2 (SUTURE) IMPLANT
SUT VIC AB 3-0 FS2 27 (SUTURE) IMPLANT
SUT VICRYL RAPIDE 4/0 PS 2 (SUTURE) ×2 IMPLANT
SUTURE FIBERWR 4-0 18 DIA BLUE (SUTURE) IMPLANT
SYR CONTROL 10ML LL (SYRINGE) ×2 IMPLANT
TOWEL OR 17X24 6PK STRL BLUE (TOWEL DISPOSABLE) ×2 IMPLANT
TOWEL OR 17X26 10 PK STRL BLUE (TOWEL DISPOSABLE) ×2 IMPLANT
TUBE CONNECTING 12X1/4 (SUCTIONS) IMPLANT
UNDERPAD 30X30 INCONTINENT (UNDERPADS AND DIAPERS) ×2 IMPLANT
WATER STERILE IRR 1000ML POUR (IV SOLUTION) ×1 IMPLANT

## 2012-08-31 NOTE — Anesthesia Procedure Notes (Signed)
Procedure Name: LMA Insertion Date/Time: 08/31/2012 8:34 AM Performed by: Jerilee Hoh Pre-anesthesia Checklist: Patient identified, Emergency Drugs available, Suction available and Patient being monitored Patient Re-evaluated:Patient Re-evaluated prior to inductionOxygen Delivery Method: Circle system utilized Preoxygenation: Pre-oxygenation with 100% oxygen Intubation Type: IV induction LMA: LMA inserted LMA Size: 5.0 Tube type: Oral Number of attempts: 1 Placement Confirmation: positive ETCO2 and breath sounds checked- equal and bilateral Tube secured with: Tape Dental Injury: Teeth and Oropharynx as per pre-operative assessment

## 2012-08-31 NOTE — Op Note (Signed)
NAMESINCLAIR, ALLIGOOD NO.:  [id number]  MEDICAL RECORD NO.:  [id number]  LOCATION:  [facility]                         FACILITY:  [facility]  PHYSICIAN:  Artist Pais. Claryce Friel, M.D.DATE OF BIRTH:  1985-10-06  DATE OF PROCEDURE:  08/31/2012 DATE OF DISCHARGE:                              OPERATIVE REPORT   PREOPERATIVE DIAGNOSIS:  Right wrist dorsal ulnar laceration.  POSTOPERATIVE DIAGNOSIS:  Right wrist dorsal ulnar laceration.  PROCEDURE:  Exploration repair of extensor carpi ulnaris, and extensor digiti minimi tendons.  SURGEON:  Artist Pais. Mina Marble, MD  ASSISTANT:  None.  ANESTHESIA:  General.  COMPLICATIONS:  No complications.  DRAINS:  No drains.  DESCRIPTION OF PROCEDURE:  The patient was taken to the operating suite. After induction of general anesthesia, right upper extremity was prepped and draped in usual sterile fashion.  An Esmarch was used to exsanguinate the limb.  Tourniquet was then inflated to 250 mmHg.  At this point in time, an oblique laceration of the distal ulna was extended proximally and distally approximately 5-6 cm.  Dissection was carried down to the fifth and sixth dorsal compartments.  The retinaculum overlying the fifth and sixth dorsal compartment was lacerated.  There were partial near complete lacerations of the EDQ and EDC tendons.  The wound was thoroughly debrided of nonviable material. The extensor tendons repaired with 4-0 FiberWire and 6-0 Prolene. FiberWire repair of the retinaculum overlying the fifth and sixth compartments with 6-0 Prolene.  The wound was then closed with 4-0 Vicryl Rapide.  Xeroform 4x4s fluffs and ulnar gutter splint was applied.  The patient tolerated the procedure well and went to the recovery room in stable fashion.     Artist Pais Mina Marble, M.D.     MAW/MEDQ  D:  08/31/2012  T:  08/31/2012  Job:  [id number]

## 2012-08-31 NOTE — Anesthesia Preprocedure Evaluation (Addendum)
Anesthesia Evaluation  Patient identified by MRN, date of birth, ID band Patient awake    Reviewed: Allergy & Precautions, H&P , NPO status , Patient's Chart, lab work & pertinent test results  History of Anesthesia Complications Negative for: history of anesthetic complications  Airway Mallampati: II TM Distance: >3 FB Neck ROM: Full    Dental  (+) Poor Dentition and Dental Advisory Given   Pulmonary Current Smoker,  breath sounds clear to auscultation  Pulmonary exam normal       Cardiovascular negative cardio ROS  Rhythm:Regular Rate:Normal     Neuro/Psych negative neurological ROS     GI/Hepatic negative GI ROS, Neg liver ROS,   Endo/Other  Morbid obesity  Renal/GU negative Renal ROS     Musculoskeletal   Abdominal (+) + obese,   Peds  Hematology   Anesthesia Other Findings   Reproductive/Obstetrics                          Anesthesia Physical Anesthesia Plan  ASA: III  Anesthesia Plan: General   Post-op Pain Management:    Induction: Intravenous  Airway Management Planned: LMA  Additional Equipment:   Intra-op Plan:   Post-operative Plan:   Informed Consent: I have reviewed the patients History and Physical, chart, labs and discussed the procedure including the risks, benefits and alternatives for the proposed anesthesia with the patient or authorized representative who has indicated his/her understanding and acceptance.   Dental advisory given  Plan Discussed with: CRNA and Surgeon  Anesthesia Plan Comments: (Plan routine monitors, GA- LMA OK)        Anesthesia Quick Evaluation

## 2012-08-31 NOTE — Anesthesia Postprocedure Evaluation (Signed)
  Anesthesia Post-op Note  Patient: Ricky Huang  Procedure(s) Performed: Procedure(s): EXPLORE AND REPAIR AS NEEDED RIGHT ARM (Right)  Patient Location: PACU  Anesthesia Type:General  Level of Consciousness: awake, alert , oriented and patient cooperative  Airway and Oxygen Therapy: Patient Spontanous Breathing  Post-op Pain: mild  Post-op Assessment: Post-op Vital signs reviewed, Patient's Cardiovascular Status Stable, Respiratory Function Stable, Patent Airway, No signs of Nausea or vomiting and Pain level controlled  Post-op Vital Signs: Reviewed and stable  Complications: No apparent anesthesia complications

## 2012-08-31 NOTE — Preoperative (Signed)
Beta Blockers   Reason not to administer Beta Blockers:Not Applicable 

## 2012-08-31 NOTE — H&P (Signed)
Ricky Huang is an 27 y.o. male.   Chief Complaint: right wrist pain with deep laceration HPI: as above with deep laceration to right wrist  Past Medical History  Diagnosis Date  . Anxiety   . Medical history non-contributory     History reviewed. No pertinent past surgical history.  History reviewed. No pertinent family history. Social History:  reports that he has been smoking.  He has never used smokeless tobacco. He reports that  drinks alcohol. He reports that he does not use illicit drugs.  Allergies:  Allergies  Allergen Reactions  . Morphine And Related Nausea Only  . Tape     Bruising with plastic tape  . Tramadol Nausea And Vomiting    Medications Prior to Admission  Medication Sig Dispense Refill  . oxyCODONE-acetaminophen (PERCOCET/ROXICET) 5-325 MG per tablet Take 1 tablet by mouth every 4 (four) hours as needed for pain.      Marland Kitchen HYDROcodone-acetaminophen (NORCO) 5-325 MG per tablet Take 2 tablets by mouth every 4 (four) hours as needed for pain.  10 tablet  0    No results found for this or any previous visit (from the past 48 hour(s)). No results found.  Review of Systems  All other systems reviewed and are negative.    Blood pressure 136/68, pulse 74, temperature 97.7 F (36.5 C), temperature source Oral, resp. rate 18, height 6' (1.829 m), weight 133.8 kg (294 lb 15.6 oz), SpO2 99.00%. Physical Exam  Constitutional: He is oriented to person, place, and time. He appears well-developed and well-nourished.  HENT:  Head: Normocephalic and atraumatic.  Cardiovascular: Normal rate.   Respiratory: Effort normal.  Musculoskeletal:       Right wrist: He exhibits laceration.  righjt wrist dorsoulnar laceration with weakness in extension and ulnar deviation  Neurological: He is alert and oriented to person, place, and time.  Skin: Skin is warm.  Psychiatric: He has a normal mood and affect. His behavior is normal. Judgment and thought content normal.      Assessment/Plan As above  Plan explore and repair  Laken Lobato A 08/31/2012, 8:15 AM

## 2012-08-31 NOTE — Transfer of Care (Signed)
Immediate Anesthesia Transfer of Care Note  Patient: Ricky Huang  Procedure(s) Performed: Procedure(s): EXPLORE AND REPAIR AS NEEDED RIGHT ARM (Right)  Patient Location: PACU  Anesthesia Type:General  Level of Consciousness: awake, alert , oriented and patient cooperative  Airway & Oxygen Therapy: Patient Spontanous Breathing  Post-op Assessment: Report given to PACU RN, Post -op Vital signs reviewed and stable and Patient moving all extremities  Post vital signs: Reviewed and stable  Complications: No apparent anesthesia complications

## 2012-08-31 NOTE — Op Note (Signed)
See note [id number]

## 2012-09-04 ENCOUNTER — Encounter (HOSPITAL_COMMUNITY): Payer: Self-pay | Admitting: Orthopedic Surgery

## 2013-11-08 ENCOUNTER — Other Ambulatory Visit: Payer: Self-pay

## 2013-11-08 ENCOUNTER — Emergency Department (HOSPITAL_COMMUNITY)

## 2013-11-08 ENCOUNTER — Emergency Department (HOSPITAL_COMMUNITY)
Admission: EM | Admit: 2013-11-08 | Discharge: 2013-11-08 | Disposition: A | Discharge status: Home / Self Care | Attending: Emergency Medicine | Admitting: Emergency Medicine

## 2013-11-08 ENCOUNTER — Encounter (HOSPITAL_COMMUNITY): Payer: Self-pay | Admitting: Emergency Medicine

## 2013-11-08 DIAGNOSIS — R112 Nausea with vomiting, unspecified: Secondary | ICD-10-CM | POA: Diagnosis not present

## 2013-11-08 DIAGNOSIS — R079 Chest pain, unspecified: Secondary | ICD-10-CM | POA: Insufficient documentation

## 2013-11-08 DIAGNOSIS — Z87891 Personal history of nicotine dependence: Secondary | ICD-10-CM | POA: Insufficient documentation

## 2013-11-08 DIAGNOSIS — Z8659 Personal history of other mental and behavioral disorders: Secondary | ICD-10-CM | POA: Insufficient documentation

## 2013-11-08 LAB — BASIC METABOLIC PANEL
Anion gap: 9 (ref 5–15)
BUN: 11 mg/dL (ref 6–23)
CO2: 27 mEq/L (ref 19–32)
Calcium: 9.3 mg/dL (ref 8.4–10.5)
Chloride: 104 mEq/L (ref 96–112)
Creatinine, Ser: 1.01 mg/dL (ref 0.50–1.35)
GFR calc Af Amer: >90 mL/min (ref >90)
GFR calc non Af Amer: >90 mL/min (ref >90)
Glucose, Bld: 125 mg/dL — ABNORMAL HIGH (ref 70–99)
Potassium: 4.6 mEq/L (ref 3.7–5.3)
Sodium: 140 mEq/L (ref 137–147)

## 2013-11-08 LAB — TROPONIN I: Troponin I: <0.3 ng/mL (ref <0.30)

## 2013-11-08 LAB — CBC
HCT: 43.3 % (ref 39.0–52.0)
Hemoglobin: 14.8 g/dL (ref 13.0–17.0)
MCH: 29.6 pg (ref 26.0–34.0)
MCHC: 34.2 g/dL (ref 30.0–36.0)
MCV: 86.6 fL (ref 78.0–100.0)
Platelets: 264 10*3/uL (ref 150–400)
RBC: 5 MIL/uL (ref 4.22–5.81)
RDW: 12.5 % (ref 11.5–15.5)
WBC: 5.8 10*3/uL (ref 4.0–10.5)

## 2013-11-08 MED ORDER — ASPIRIN 81 MG PO CHEW
324.0000 mg | CHEWABLE_TABLET | Freq: Once | ORAL | Status: AC
  Administered 2013-11-08: 324 mg via ORAL
  Filled 2013-11-08: qty 4

## 2013-11-08 MED ORDER — ONDANSETRON 4 MG PO TBDP
4.0000 mg | ORAL_TABLET | Freq: Once | ORAL | Status: AC
  Administered 2013-11-08: 4 mg via ORAL
  Filled 2013-11-08: qty 1

## 2013-11-08 NOTE — ED Notes (Signed)
Pt reports emesis, and chest pressure since this am. Pt reports diaphoretic intermittent this am with pain onset. Pt reports "it feels like someone is sitting on my chest." nad noted. Pt alert and oriented. No dyspnea noted in triage. Pt non-diaphoretic at this time.

## 2013-11-08 NOTE — ED Provider Notes (Signed)
CSN: [id number]     Arrival date & time 11/08/13  1408 History   First MD Initiated Contact with Patient 11/08/13 1501     Chief Complaint  Patient presents with  . Chest Pain     (Consider location/radiation/quality/duration/timing/severity/associated sxs/prior Treatment) HPI   28 year old male with chest pain. Pressure in the center of his chest since this morning. Associated at onset with nausea and vomiting. He cannot remember if vomiting or chest pain started first. Initially was diaphoretic at the time he is vomiting. This has since resolved. No respiratory complaints. No fevers or chills. Nausea and diaphoresis has resolved as well. Still has a light pressure sensation in the center of stress. Patient is currently incarcerated. He denies any drug use. No change with exertion. No unusual leg pain or swelling.  Past Medical History  Diagnosis Date  . Anxiety   . Medical history non-contributory    Past Surgical History  Procedure Laterality Date  . Wound exploration Right 08/31/2012    Procedure: EXPLORE AND REPAIR AS NEEDED RIGHT ARM;  Surgeon: Marlowe ShoresMatthew A Weingold, MD;  Location: [facility] OR;  Service: Orthopedics;  Laterality: Right;  . Wrist surgery     History reviewed. No pertinent family history. History  Substance Use Topics  . Smoking status: Former Smoker -- 1.00 packs/day  . Smokeless tobacco: Never Used  . Alcohol Use: No     Comment: occasional    Review of Systems  All systems reviewed and negative, other than as noted in HPI.   Allergies  Morphine and related; Tape; and Tramadol  Home Medications   Prior to Admission medications   Medication Sig Start Date End Date Taking? Authorizing Provider  HYDROcodone-acetaminophen (NORCO) 5-325 MG per tablet Take 2 tablets by mouth every 4 (four) hours as needed for pain. 08/27/12   Loren Raceravid Yelverton, MD  oxyCODONE-acetaminophen (PERCOCET/ROXICET) 5-325 MG per tablet Take 1 tablet by mouth every 4 (four) hours as needed  for pain.    Historical Provider, MD  oxyCODONE-acetaminophen (ROXICET) 5-325 MG per tablet Take 1 tablet by mouth every 4 (four) hours as needed for pain. 08/31/12   Dairl PonderMatthew Weingold, MD   BP 123/78  Pulse 84  Temp(Src) 98.3 F (36.8 C) (Oral)  Resp 18  Ht 6' (1.829 m)  Wt 295 lb (133.811 kg)  BMI 40.00 kg/m2  SpO2 98% Physical Exam  Constitutional: He appears well-developed and well-nourished. No distress.  HENT:  Head: Normocephalic and atraumatic.  Eyes: Conjunctivae are normal. Right eye exhibits no discharge. Left eye exhibits no discharge.  Neck: Neck supple.  Cardiovascular: Normal rate, regular rhythm and normal heart sounds.  Exam reveals no gallop and no friction rub.   No murmur heard. Pulmonary/Chest: Effort normal and breath sounds normal. No respiratory distress.  Abdominal: Soft. He exhibits no distension. There is no tenderness.  Musculoskeletal: He exhibits no edema or tenderness.  Lower extremities symmetric as compared to each other. No calf tenderness. Negative Homan's. No palpable cords.   Neurological: He is alert.  Skin: Skin is warm and dry.  Psychiatric: He has a normal mood and affect. His behavior is normal. Thought content normal.  Nursing note and vitals reviewed.   ED Course  Procedures (including critical care time) Labs Review Labs Reviewed  CBC  BASIC METABOLIC PANEL  TROPONIN I    Imaging Review Dg Chest 2 View  11/08/2013   CLINICAL DATA:  Chest pain.  EXAM: CHEST  2 VIEW  COMPARISON:  04/27/2012.  FINDINGS: Mediastinal  and hilar structures normal. Lungs are clear. Heart size normal. No pleural effusion or pneumothorax.  IMPRESSION: No acute cardiopulmonary disease.   Electronically Signed   By: Maisie Fushomas  Register   On: 11/08/2013 14:51     EKG Interpretation None      EKG:  Rhythm: normal sinus Vent. rate 65 BPM PR interval 186 ms QRS duration 94 ms QT/QTc 376/391 ms ST segments: NS ST changes   MDM   Final diagnoses:   Chest pain       Raeford RazorStephen Guila Owensby, MD 11/14/13 1128

## 2013-11-08 NOTE — Discharge Instructions (Signed)

## 2014-08-05 ENCOUNTER — Emergency Department (HOSPITAL_COMMUNITY)
Admission: EM | Admit: 2014-08-05 | Discharge: 2014-08-05 | Attending: Emergency Medicine | Admitting: Emergency Medicine

## 2014-08-05 ENCOUNTER — Encounter (HOSPITAL_COMMUNITY): Payer: Self-pay | Admitting: Emergency Medicine

## 2014-08-05 ENCOUNTER — Emergency Department (HOSPITAL_COMMUNITY)

## 2014-08-05 DIAGNOSIS — S80812A Abrasion, left lower leg, initial encounter: Secondary | ICD-10-CM | POA: Diagnosis not present

## 2014-08-05 DIAGNOSIS — Y939 Activity, unspecified: Secondary | ICD-10-CM | POA: Insufficient documentation

## 2014-08-05 DIAGNOSIS — R109 Unspecified abdominal pain: Secondary | ICD-10-CM

## 2014-08-05 DIAGNOSIS — Z87891 Personal history of nicotine dependence: Secondary | ICD-10-CM | POA: Insufficient documentation

## 2014-08-05 DIAGNOSIS — S301XXA Contusion of abdominal wall, initial encounter: Secondary | ICD-10-CM | POA: Diagnosis not present

## 2014-08-05 DIAGNOSIS — X58XXXA Exposure to other specified factors, initial encounter: Secondary | ICD-10-CM | POA: Insufficient documentation

## 2014-08-05 DIAGNOSIS — Y929 Unspecified place or not applicable: Secondary | ICD-10-CM | POA: Diagnosis not present

## 2014-08-05 DIAGNOSIS — S80811A Abrasion, right lower leg, initial encounter: Secondary | ICD-10-CM | POA: Diagnosis not present

## 2014-08-05 DIAGNOSIS — S3991XA Unspecified injury of abdomen, initial encounter: Secondary | ICD-10-CM | POA: Diagnosis present

## 2014-08-05 DIAGNOSIS — Z79899 Other long term (current) drug therapy: Secondary | ICD-10-CM | POA: Insufficient documentation

## 2014-08-05 DIAGNOSIS — Z23 Encounter for immunization: Secondary | ICD-10-CM | POA: Insufficient documentation

## 2014-08-05 DIAGNOSIS — R5383 Other fatigue: Secondary | ICD-10-CM | POA: Insufficient documentation

## 2014-08-05 DIAGNOSIS — T1490XA Injury, unspecified, initial encounter: Secondary | ICD-10-CM

## 2014-08-05 DIAGNOSIS — Y999 Unspecified external cause status: Secondary | ICD-10-CM | POA: Diagnosis not present

## 2014-08-05 DIAGNOSIS — Z8659 Personal history of other mental and behavioral disorders: Secondary | ICD-10-CM | POA: Insufficient documentation

## 2014-08-05 LAB — COMPREHENSIVE METABOLIC PANEL
ALK PHOS: 75 U/L (ref 38–126)
ALT: 31 U/L (ref 17–63)
AST: 27 U/L (ref 15–41)
Albumin: 4 g/dL (ref 3.5–5.0)
Anion gap: 9 (ref 5–15)
BILIRUBIN TOTAL: 0.7 mg/dL (ref 0.3–1.2)
BUN: 15 mg/dL (ref 6–20)
CO2: 24 mmol/L (ref 22–32)
CREATININE: 1.16 mg/dL (ref 0.61–1.24)
Calcium: 8.7 mg/dL — ABNORMAL LOW (ref 8.9–10.3)
Chloride: 103 mmol/L (ref 101–111)
GFR calc non Af Amer: >60 mL/min (ref >60)
Glucose, Bld: 118 mg/dL — ABNORMAL HIGH (ref 65–99)
POTASSIUM: 3.3 mmol/L — AB (ref 3.5–5.1)
SODIUM: 136 mmol/L (ref 135–145)
TOTAL PROTEIN: 7.7 g/dL (ref 6.5–8.1)

## 2014-08-05 LAB — CBC WITH DIFFERENTIAL/PLATELET
BASOS ABS: 0 10*3/uL (ref 0.0–0.1)
Basophils Relative: 0 % (ref 0–1)
EOS ABS: 0.2 10*3/uL (ref 0.0–0.7)
Eosinophils Relative: 2 % (ref 0–5)
HCT: 46.8 % (ref 39.0–52.0)
Hemoglobin: 15.6 g/dL (ref 13.0–17.0)
Lymphocytes Relative: 20 % (ref 12–46)
Lymphs Abs: 2.2 10*3/uL (ref 0.7–4.0)
MCH: 30.3 pg (ref 26.0–34.0)
MCHC: 33.3 g/dL (ref 30.0–36.0)
MCV: 90.9 fL (ref 78.0–100.0)
MONOS PCT: 10 % (ref 3–12)
Monocytes Absolute: 1.1 10*3/uL — ABNORMAL HIGH (ref 0.1–1.0)
NEUTROS PCT: 68 % (ref 43–77)
Neutro Abs: 7.6 10*3/uL (ref 1.7–7.7)
Platelets: 308 10*3/uL (ref 150–400)
RBC: 5.15 MIL/uL (ref 4.22–5.81)
RDW: 13.3 % (ref 11.5–15.5)
WBC: 11.1 10*3/uL — ABNORMAL HIGH (ref 4.0–10.5)

## 2014-08-05 LAB — ETHANOL

## 2014-08-05 LAB — ACETAMINOPHEN LEVEL

## 2014-08-05 LAB — SALICYLATE LEVEL: Salicylate Lvl: <4 mg/dL (ref 2.8–30.0)

## 2014-08-05 MED ORDER — POTASSIUM CHLORIDE CRYS ER 20 MEQ PO TBCR
40.0000 meq | EXTENDED_RELEASE_TABLET | Freq: Once | ORAL | Status: AC
  Administered 2014-08-05: 40 meq via ORAL
  Filled 2014-08-05: qty 2

## 2014-08-05 MED ORDER — TETANUS-DIPHTH-ACELL PERTUSSIS 5-2.5-18.5 LF-MCG/0.5 IM SUSP
0.5000 mL | Freq: Once | INTRAMUSCULAR | Status: AC
  Administered 2014-08-05: 0.5 mL via INTRAMUSCULAR
  Filled 2014-08-05: qty 0.5

## 2014-08-05 MED ORDER — IOHEXOL 300 MG/ML  SOLN
100.0000 mL | Freq: Once | INTRAMUSCULAR | Status: AC | PRN
  Administered 2014-08-05: 100 mL via INTRAVENOUS
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
## 2014-08-05 NOTE — ED Notes (Signed)
Per [employer] report, pt has been on a "crack binge" for the last two weeks and has not slept.

## 2014-08-05 NOTE — Discharge Instructions (Signed)
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
## 2014-08-05 NOTE — ED Provider Notes (Signed)
2:05 PM patient is sleepy arousable verbal stimulus ambulate unassisted. Abdomen is obese. There is a grapefruit-sized ecchymotic area at right mid abdomen with corresponding tenderness. No guarding or rigidity bilateral lower extremities with multiple linear abrasions.  Doug Sou, MD 08/05/14 1406

## 2017-01-10 DEATH — deceased

## 2017-06-08 IMAGING — CT CT ABD-PELV W/ CM
2 of 5 series · 17 of 46 positions shown, 19 images · IV contrast (OMNIPAQUE 300)
Comparison: August 19, 2006

CLINICAL DATA: Right lower abdomen bruise question trauma.

EXAM:
CT ABDOMEN AND PELVIS WITH CONTRAST
TECHNIQUE: Multidetector CT imaging of the abdomen and pelvis was performed
using the standard protocol following bolus administration of
intravenous contrast.
CONTRAST:  100mL OMNIPAQUE IOHEXOL 300 MG/ML  SOLN

[Series 2: abd/pel with · axial · 0.98mm/px · z∈[+1611,+2061]mm · 14 of 102 slices shown, 16 images]
[im 6/102  soft-tissue]
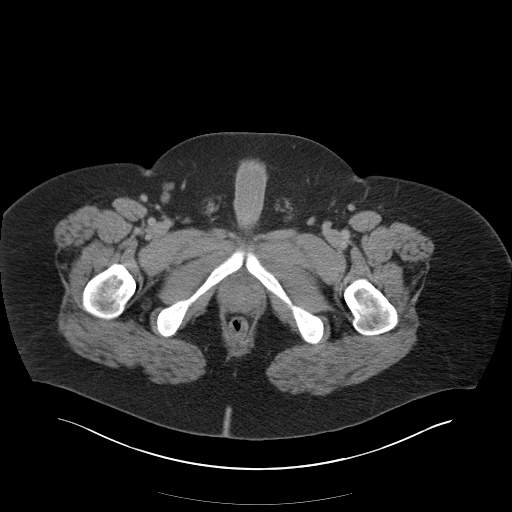
[im 6/102  bone]
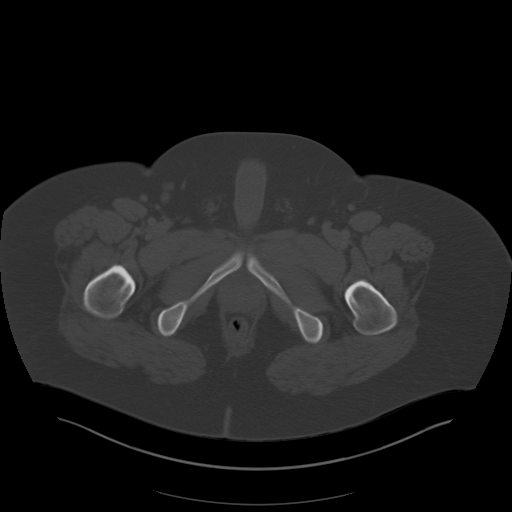
[im 16/102  soft-tissue]
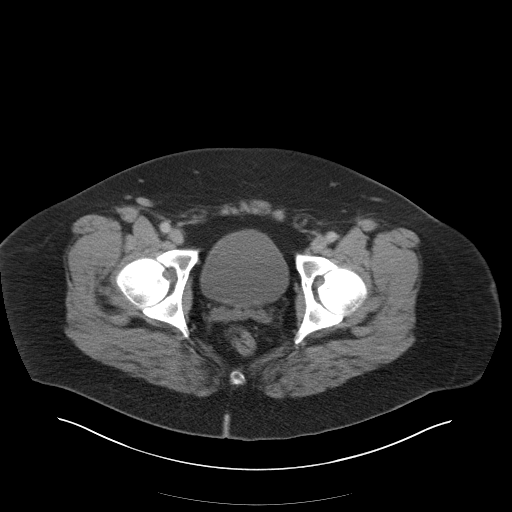
[im 21/102  soft-tissue]
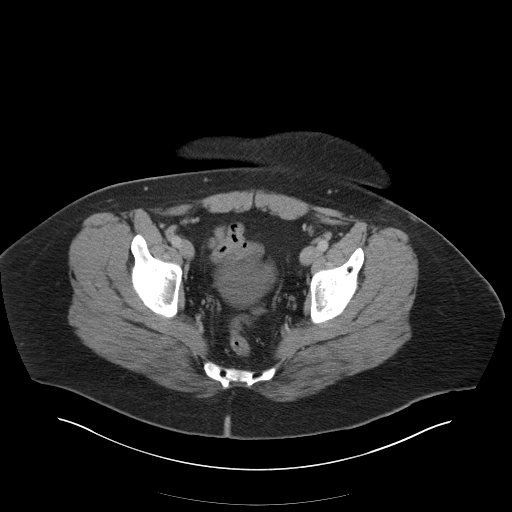
[im 26/102  soft-tissue]
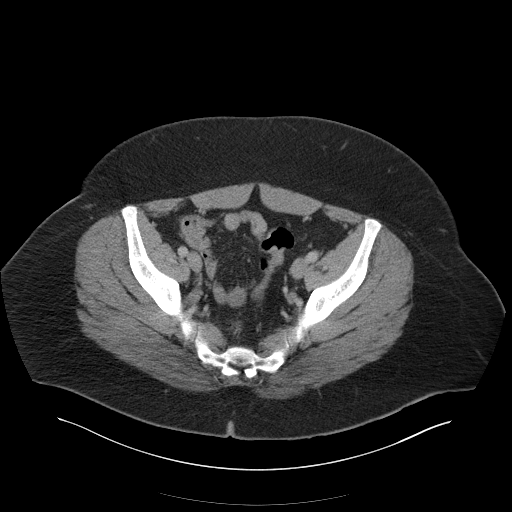
[im 36/102  soft-tissue]
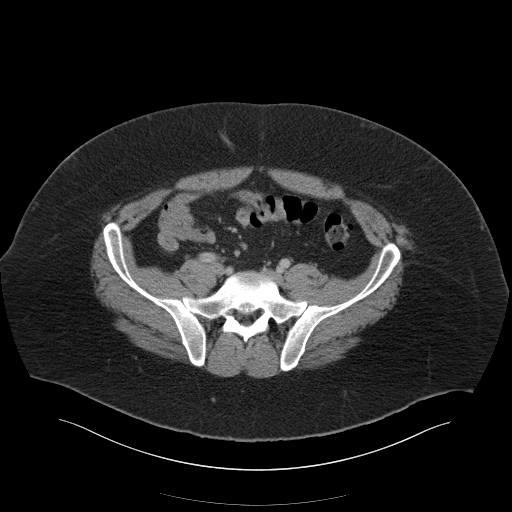
[im 41/102  soft-tissue]
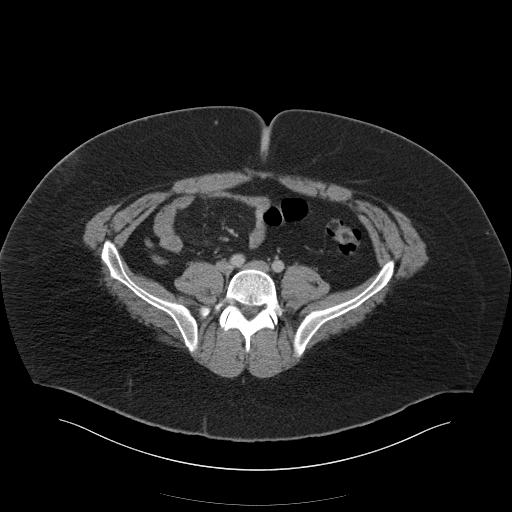
[im 46/102  soft-tissue]
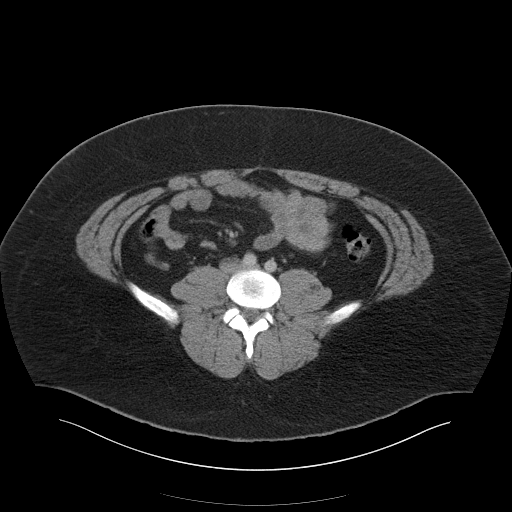
[im 56/102  soft-tissue]
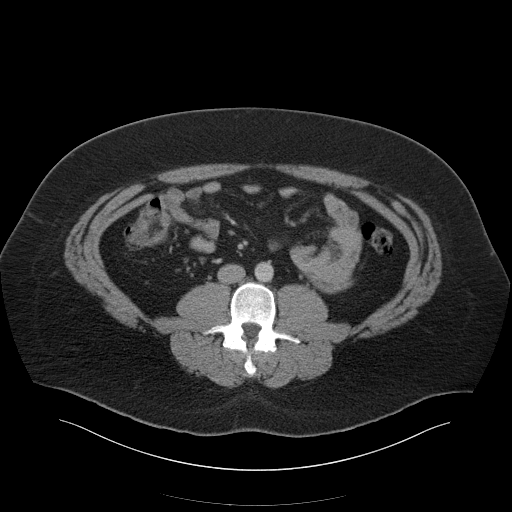
[im 61/102  soft-tissue]
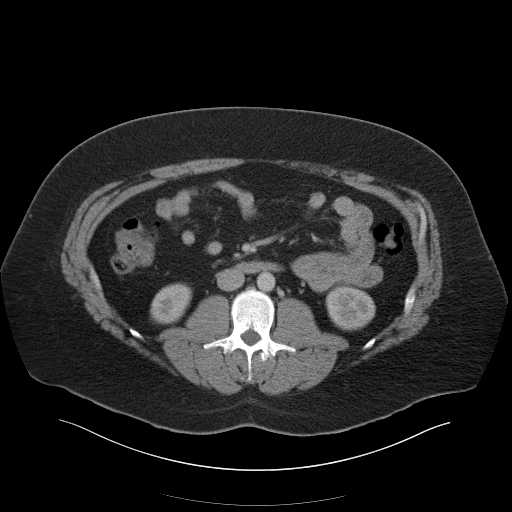
[im 61/102  bone]
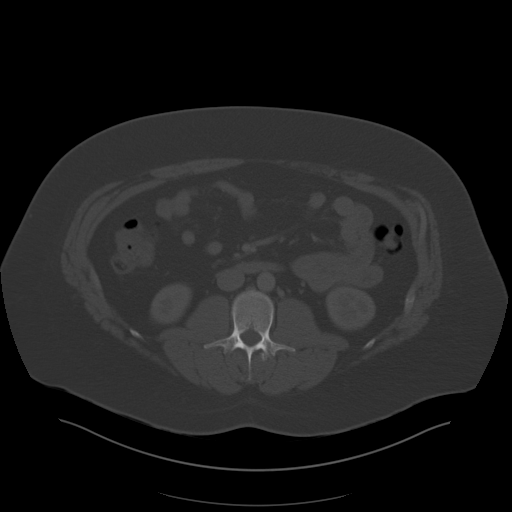
[im 66/102  soft-tissue]
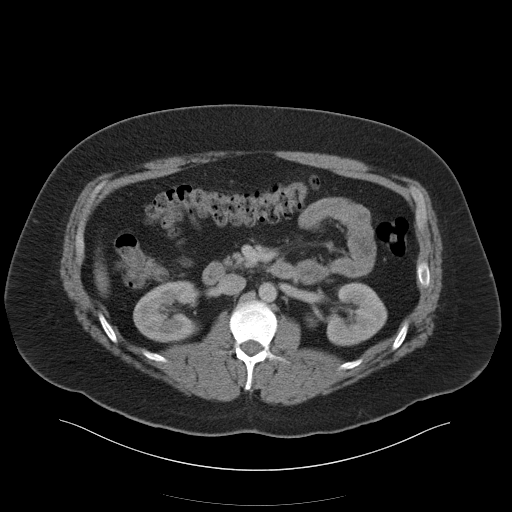
[im 76/102  soft-tissue]
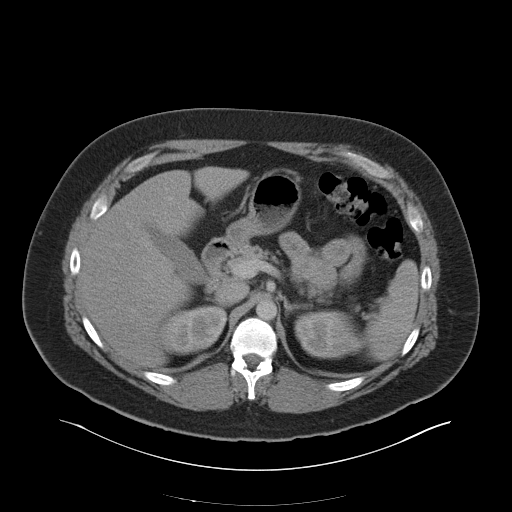
[im 81/102  soft-tissue]
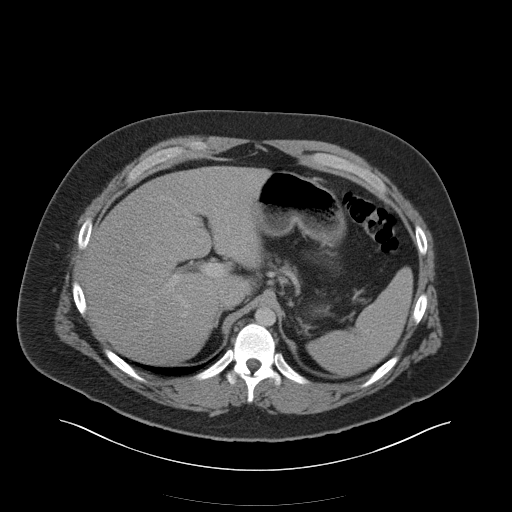
[im 86/102  soft-tissue]
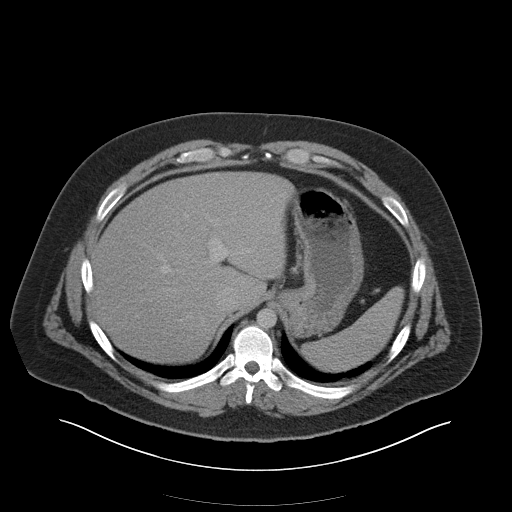
[im 96/102  soft-tissue]
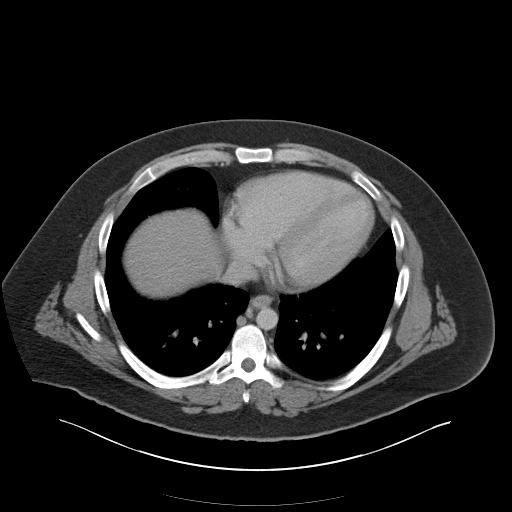

[Series 5: coronal a/|p · coronal · 0.87mm/px · 3 of 92 slices shown]
[im 31/92  soft-tissue]
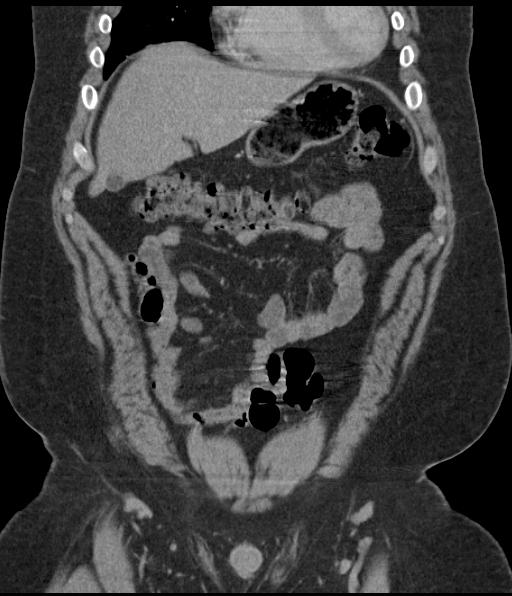
[im 41/92  soft-tissue]
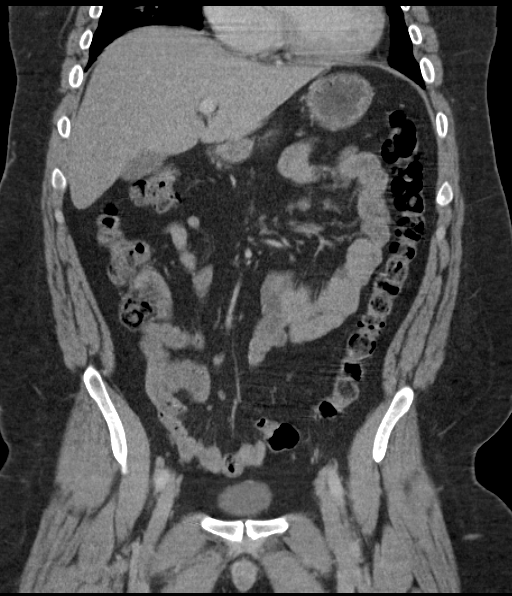
[im 51/92  soft-tissue]
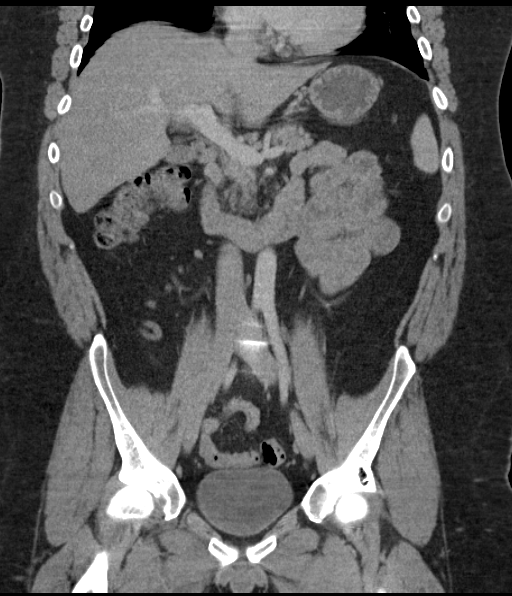

[17 of 46 positions shown; findings below may reference images not displayed]

FINDINGS: The liver, spleen, pancreas, gallbladder, adrenal glands and kidneys
are normal. There is no hydronephrosis bilaterally. The aorta is
normal. There is no abdominal lymphadenopathy. There is no small
bowel obstruction or diverticulitis. The appendix is normal.

Fluid-filled bladder is normal. There is no pelvic lymphadenopathy.
There is mild superficial right lateral anterior lower abdominal fat
stranding correlating to the clinically described bruise. The lung
bases are clear. The visualized bones are normal.
IMPRESSION: No acute intra-abdominal or intrapelvic abnormality.

Mild superficial right lateral anterior lower abdominal fat
stranding correlating to the clinical described bruise.
